# Patient Record
Sex: Female | Born: 1965 | Race: White | Hispanic: No | Marital: Married
Health system: Southern US, Community
[De-identification: ages and names within clinical notes are randomized; demographics above are authoritative.]

---

## 2002-08-27 ENCOUNTER — Encounter: Admission: RE | Admit: 2002-08-27 | Discharge: 2002-08-27 | Payer: Self-pay

## 2005-09-19 ENCOUNTER — Emergency Department (HOSPITAL_COMMUNITY): Admission: EM | Admit: 2005-09-19 | Discharge: 2005-09-20 | Payer: Self-pay | Admitting: Emergency Medicine

## 2009-08-03 ENCOUNTER — Encounter: Admission: RE | Admit: 2009-08-03 | Discharge: 2009-08-03 | Payer: Self-pay | Admitting: Family Medicine

## 2010-07-25 ENCOUNTER — Other Ambulatory Visit: Payer: Self-pay | Admitting: Family Medicine

## 2010-07-25 DIAGNOSIS — N939 Abnormal uterine and vaginal bleeding, unspecified: Secondary | ICD-10-CM

## 2010-07-30 ENCOUNTER — Ambulatory Visit
Admission: RE | Admit: 2010-07-30 | Discharge: 2010-07-30 | Disposition: A | Discharge status: Home / Self Care | Payer: No Typology Code available for payment source | Source: Ambulatory Visit | Attending: Family Medicine | Admitting: Family Medicine

## 2010-07-30 DIAGNOSIS — N939 Abnormal uterine and vaginal bleeding, unspecified: Secondary | ICD-10-CM

## 2010-08-22 ENCOUNTER — Other Ambulatory Visit: Payer: Self-pay | Admitting: Gynecology

## 2010-08-22 ENCOUNTER — Ambulatory Visit: Payer: No Typology Code available for payment source | Attending: Gynecology | Admitting: Gynecology

## 2010-08-22 DIAGNOSIS — N938 Other specified abnormal uterine and vaginal bleeding: Secondary | ICD-10-CM | POA: Insufficient documentation

## 2010-08-22 DIAGNOSIS — F17200 Nicotine dependence, unspecified, uncomplicated: Secondary | ICD-10-CM | POA: Insufficient documentation

## 2010-08-22 DIAGNOSIS — N949 Unspecified condition associated with female genital organs and menstrual cycle: Secondary | ICD-10-CM | POA: Insufficient documentation

## 2010-08-22 DIAGNOSIS — N9489 Other specified conditions associated with female genital organs and menstrual cycle: Secondary | ICD-10-CM | POA: Insufficient documentation

## 2010-08-23 NOTE — Consult Note (Signed)
NAMETORRIN, FREIN                 ACCOUNT NO.:  [id number]  MEDICAL RECORD NO.:  [id number]          PATIENT TYPE:  LOCATION:                                 FACILITY:  PHYSICIAN:  De Blanch, M.D. DATE OF BIRTH:  DATE OF CONSULTATION:  08/22/2010 DATE OF DISCHARGE:                                CONSULTATION   REFERRING PHYSICIAN:  Burnell Blanks, MD  CHIEF COMPLAINT:  Abnormal bleeding and right adnexal mass.  HISTORY OF PRESENT ILLNESS:  This 45 year old white separated female is seen in consultation at request of Dr. Nathanial Rancher regarding further evaluation of an abnormal bleeding and a recently recognized right adnexal mass.  The patient had approximately 6 weeks of bleeding in January and into February.  As part of the workup, she underwent a transvaginal ultrasound that showed an endometrial stripe of 5 mm and a right adnexal solid mass measuring 5.9 x 6.3 x 4.7 cm.  There was no evidence of free fluid and the left ovary appeared normal.  The patient has resumed bleeding as of early April which is considered spotting at the present time.  She denies any cramping or any constitutional symptoms or weight loss.  The patient has no significant past gynecologic history.  PAST MEDICAL HISTORY/MEDICAL ILLNESSES:  Kidney stones, the patient passed 2 years ago.  PAST SURGICAL HISTORY:  Cesarean section, arthroscopic knee surgery, excision of lymph node in the neck  OBSTETRICAL HISTORY:  Gravida 2.  CURRENT MEDICATIONS:  None.  DRUG ALLERGIES:  SULFA (hives).  SOCIAL HISTORY:  The patient is separated.  She is an Airline pilot.  She smokes 1 pack per day.  She recently had a Pap smear that was negative.  REVIEW OF SYSTEMS:  Ten-point comprehensive review of systems negative except as noted above.  PHYSICAL EXAMINATION:  VITAL SIGNS:  Height 5 feet 1 inch, weight 163 pounds, blood pressure 198/60. GENERAL:  The patient is a healthy white female, in no acute  distress. HEENT:  Negative. NECK:  Supple without thyromegaly.  There is no supraclavicular or inguinal adenopathy. ABDOMEN:  Soft, nontender.  No mass, organomegaly, ascites or hernias noted. PELVIC EXAM:  EG, BUS, vagina, bladder, urethra are normal.  The cervix is parous.  Uterus is retroverted and upper limits of normal size.  I am unable to palpate the adnexal mass.  Rectovaginal exam confirms. EXTREMITIES:  Lower extremities without edema or varicosities.  PROCEDURE NOTE:  Endometrial biopsy is performed with a Pipelle aspirator on 2 passes obtaining a minimal amount of tissue which is sent to pathology.  IMPRESSION: 1. Abnormal uterine bleeding in a perimenopausal patient.  Certainly     this may be normal, but we will await the results of the biopsy     before making further recommendations. 2. Solid adnexal mass.  I recommended to the patient that this be     removed surgically and intraoperative frozen section be obtained.     Certainly this could be a benign tumor such as an ovarian fibroma     but could in addition be malignancy such as a granulosis cell     tumor.  Pros and cons of removing the other ovary and completing a     hysterectomy were discussed in detail.  At the present time, the     patient is inclined to proceed with total abdominal hysterectomy,     bilateral salpingo-oophorectomy.  She understands that if she has     ovarian cancer, then surgical staging including omentectomy and     lymphadenectomy will be performed.  We will await endometrial biopsy report before making final plans, but we will tentatively schedule surgery for Sep 11, 2010.  I believe if there is nothing that points towards a malignancy, then we could proceed using a Pfannenstiel incision.     De Blanch, M.D.     DC/MEDQ  D:  08/22/2010  T:  08/22/2010  Job:  [id number]  cc:   Telford Nab, R.N. [address] [place], [place] [address]  Burnell Blanks, MD Fax:  [phone]  Electronically Signed by De Blanch M.D. on 08/23/2010 03:54:03 PM

## 2010-08-31 ENCOUNTER — Other Ambulatory Visit: Payer: Self-pay | Admitting: Gynecology

## 2010-08-31 ENCOUNTER — Ambulatory Visit (HOSPITAL_COMMUNITY)
Admission: RE | Admit: 2010-08-31 | Discharge: 2010-08-31 | Disposition: A | Discharge status: Home / Self Care | Payer: Self-pay | Source: Ambulatory Visit | Attending: Obstetrics & Gynecology | Admitting: Obstetrics & Gynecology

## 2010-08-31 ENCOUNTER — Other Ambulatory Visit: Payer: Self-pay | Admitting: Obstetrics & Gynecology

## 2010-08-31 ENCOUNTER — Encounter (HOSPITAL_COMMUNITY): Payer: Self-pay

## 2010-08-31 DIAGNOSIS — Z01811 Encounter for preprocedural respiratory examination: Secondary | ICD-10-CM

## 2010-08-31 DIAGNOSIS — Z01818 Encounter for other preprocedural examination: Secondary | ICD-10-CM | POA: Insufficient documentation

## 2010-08-31 DIAGNOSIS — F17200 Nicotine dependence, unspecified, uncomplicated: Secondary | ICD-10-CM | POA: Insufficient documentation

## 2010-08-31 DIAGNOSIS — Z01812 Encounter for preprocedural laboratory examination: Secondary | ICD-10-CM | POA: Insufficient documentation

## 2010-08-31 DIAGNOSIS — R1900 Intra-abdominal and pelvic swelling, mass and lump, unspecified site: Secondary | ICD-10-CM

## 2010-08-31 LAB — COMPREHENSIVE METABOLIC PANEL
CO2: 23 mEq/L (ref 19–32)
Calcium: 9.2 mg/dL (ref 8.4–10.5)
Creatinine, Ser: <0.47 mg/dL (ref 0.4–1.2)
Glucose, Bld: 83 mg/dL (ref 70–99)
Sodium: 137 mEq/L (ref 135–145)
Total Protein: 6.7 g/dL (ref 6.0–8.3)

## 2010-08-31 LAB — DIFFERENTIAL
Basophils Relative: 0 % (ref 0–1)
Eosinophils Relative: 1 % (ref 0–5)
Monocytes Relative: 8 % (ref 3–12)
Neutrophils Relative %: 64 % (ref 43–77)

## 2010-08-31 LAB — CBC
HCT: 41 % (ref 36.0–46.0)
Hemoglobin: 13.8 g/dL (ref 12.0–15.0)
MCH: 32.2 pg (ref 26.0–34.0)
MCHC: 33.7 g/dL (ref 30.0–36.0)
RDW: 12.5 % (ref 11.5–15.5)

## 2010-08-31 LAB — HCG, SERUM, QUALITATIVE: Preg, Serum: NEGATIVE

## 2010-08-31 LAB — SURGICAL PCR SCREEN
MRSA, PCR: NEGATIVE
Staphylococcus aureus: NEGATIVE

## 2010-09-11 ENCOUNTER — Inpatient Hospital Stay (HOSPITAL_COMMUNITY)
Admission: RE | Admit: 2010-09-11 | Discharge: 2010-09-13 | DRG: 743 | Disposition: A | Discharge status: Home / Self Care | Payer: Self-pay | Source: Ambulatory Visit | Attending: Obstetrics & Gynecology | Admitting: Obstetrics & Gynecology

## 2010-09-11 ENCOUNTER — Other Ambulatory Visit: Payer: Self-pay | Admitting: Gynecology

## 2010-09-11 DIAGNOSIS — Z01812 Encounter for preprocedural laboratory examination: Secondary | ICD-10-CM

## 2010-09-11 DIAGNOSIS — F17200 Nicotine dependence, unspecified, uncomplicated: Secondary | ICD-10-CM | POA: Diagnosis present

## 2010-09-11 DIAGNOSIS — D279 Benign neoplasm of unspecified ovary: Principal | ICD-10-CM | POA: Diagnosis present

## 2010-09-11 LAB — ABO/RH: ABO/RH(D): O NEG

## 2010-09-12 LAB — BASIC METABOLIC PANEL
CO2: 23 mEq/L (ref 19–32)
Calcium: 8.1 mg/dL — ABNORMAL LOW (ref 8.4–10.5)
Creatinine, Ser: <0.47 mg/dL (ref 0.4–1.2)
Sodium: 136 mEq/L (ref 135–145)

## 2010-09-12 LAB — TYPE AND SCREEN
Antibody Screen: POSITIVE
Donor AG Type: NEGATIVE
PT AG Type: NEGATIVE
Unit division: 0
Unit division: 0

## 2010-09-12 LAB — CBC
MCH: 31.5 pg (ref 26.0–34.0)
MCHC: 33.1 g/dL (ref 30.0–36.0)
Platelets: 204 10*3/uL (ref 150–400)
RDW: 12.1 % (ref 11.5–15.5)

## 2010-09-13 NOTE — Op Note (Signed)
NAMEHARVEY, Lauren Woods                 ACCOUNT NO.:  [id number]  MEDICAL RECORD NO.:  [id number]           PATIENT TYPE:  I  LOCATION:  0008                         FACILITY:  [facility]  PHYSICIAN:  De Blanch, M.D.DATE OF BIRTH:  [birth year]  DATE OF PROCEDURE:  09/11/2010 DATE OF DISCHARGE:                              OPERATIVE REPORT   PREOPERATIVE DIAGNOSIS:  Complex adnexal mass.  POSTOPERATIVE DIAGNOSIS:  Right ovarian Brenner tumor.  PROCEDURE:  Total abdominal hysterectomy, bilateral salpingo- oophorectomy.  SURGEON:  De Blanch, M.D.  FIRST ASSISTANT:  Roseanna Rainbow, M.D. and Telford Nab, R.N.  ANESTHESIA:  General with orotracheal tube.  ESTIMATED BLOOD LOSS:  50 mL.  SURGICAL FINDINGS:  At the time of exploratory laparotomy, the patient was found to have a solid 6-cm right ovarian tumor.  On frozen section, this was called a benign Brenner tumor.  The left ovary and uterus were normal.  Exploration of the upper abdomen was normal.  PROCEDURE:  The patient was brought to the operating room and after satisfactory attainment of general anesthesia, she was placed in modified lithotomy position in [place] stirrups.  The anterior abdominal wall, perineum and vagina were prepped.  Foley catheter was inserted. The patient was draped.  The abdomen was entered through Pfannenstiel's incision.  Peritoneal washings were obtained from the pelvis but when the frozen section returned as benign, the washings were discarded.  The upper abdomen and pelvis were explored with the above-noted findings.  A Bookwalter retractor was assembled and the small bowel packed out of the pelvis.  The right round ligament was divided and the retroperitoneal space opened identifying the iliac vessels and ureter.  The ovarian vessels were skeletonized, clamped, cut, free tied and suture ligated. The broad ligament was incised beneath the ovarian tumor.  Uterine cornu was  doubly clamped and the tube and ovarian ligament were divided.  The right tube and ovary were handed off the operative field for frozen section with the above-noted results.  The left round ligament was divided and left retroperitoneal space was opened and the ovarian vessels skeletonized, clamped, cut, free tied and suture ligated.  The bladder flap was advanced through sharp and blunt dissection.  Uterine vessels were skeletonized and clamped, cut and suture ligated in a stepwise fashion.  The paracervical and cardinal ligaments were clamped, cut and suture ligated.  The bladder was advanced throughout this portion of procedure until we encountered the vaginal angles.  These were crossclamped and the vagina transected from its connection to the cervix.  The uterus, cervix, left tube and ovary handed off the operative field for permanent section.  Vaginal angles were transfixed.  The central portion of the vagina closed with interrupted figure-of-eight sutures of 0 Vicryl.  The pelvis was irrigated and hemostasis achieved with cautery.  The packs and retractors were removed.  Anterior abdominal wall was closed in layers, the first being a running suture of 2-0 Vicryl on the peritoneum.  The fascia was reapproximated with a running suture of 0 PDS.  Subcutaneous tissue was irrigated.  Hemostasis achieved with cautery.  The skin was closed  with skin staples and a dressing was applied.  The patient was awakened from anesthesia and taken to the recovery room in satisfactory condition.  Sponge, needle and instrument counts correct x2.     De Blanch, M.D.     DC/MEDQ  D:  09/11/2010  T:  09/11/2010  Job:  [id number]  cc:   Roseanna Rainbow, M.D. Fax: [phone]  Telford Nab, R.N. [address] [place], [place] [address]  Burnell Blanks, MD Fax: [phone]  Electronically Signed by De Blanch M.D. on 09/13/2010 10:09:36 AM

## 2010-10-05 NOTE — Discharge Summary (Signed)
  [name], [name]                 ACCOUNT NO.:  [id number]  MEDICAL RECORD NO.:  [id number]           PATIENT TYPE:  I  LOCATION:  1531                         FACILITY:  [facility]  PHYSICIAN:  Roseanna Rainbow, M.D.DATE OF BIRTH:  05-24-1965  DATE OF ADMISSION:  09/11/2010 DATE OF DISCHARGE:  09/13/2010                              DISCHARGE SUMMARY   CHIEF COMPLAINT:  Patient is a 45 year old with a pelvic mass who presents for a total abdominal hysterectomy and bilateral salpingo-oophorectomy and possible staging.  Please see the dictated history and physical.  HOSPITAL COURSE:  Patient was admitted and underwent a total abdominal hysterectomy and bilateral salpingo-oophorectomy.  Please see the dictated operative summary for findings.  On postoperative day 1, the hemoglobin was 11.2.  Basic metabolic profile was normal.  Her diet was advanced.  She was tolerating a regular diet on the day of discharge. She was discharged to home on postoperative day 2.  Prior to discharge she was started on an estrogen patch.  DISCHARGE DIAGNOSES: 1. Right ovarian benign Darnelle Bos tumor. 2. Adenomyosis.  PROCEDURE:  Total abdominal hysterectomy and bilateral salpingo- oophorectomy.  CONDITION:  Good.  DIET:  Regular.  ACTIVITY:  Pelvic rest progressive activity.  MEDICATIONS: 1. Climara 0.1 mg. 2. Percocet 5/325 one to two tablets every 6 hours as needed.  DISPOSITION:  The patient was to follow up in the GYN and oncology office.     Roseanna Rainbow, M.D.     LAJ/MEDQ  D:  09/26/2010  T:  09/26/2010  Job:  [id number]  cc:   Telford Nab, R.N. [address] [place], [place] [address]  Burnell Blanks, MD Fax: [phone]  Electronically Signed by Antionette Char M.D. on 10/05/2010 01:22:51 PM

## 2010-10-25 ENCOUNTER — Ambulatory Visit: Payer: Self-pay | Attending: Gynecologic Oncology | Admitting: Gynecologic Oncology

## 2010-10-25 DIAGNOSIS — Z9079 Acquired absence of other genital organ(s): Secondary | ICD-10-CM | POA: Insufficient documentation

## 2010-10-25 DIAGNOSIS — R209 Unspecified disturbances of skin sensation: Secondary | ICD-10-CM | POA: Insufficient documentation

## 2010-10-25 DIAGNOSIS — Z90710 Acquired absence of both cervix and uterus: Secondary | ICD-10-CM | POA: Insufficient documentation

## 2010-10-25 DIAGNOSIS — D279 Benign neoplasm of unspecified ovary: Secondary | ICD-10-CM | POA: Insufficient documentation

## 2010-10-26 NOTE — Consult Note (Signed)
  Lauren Woods, Lauren Woods                 ACCOUNT NO.:  [id number]  MEDICAL RECORD NO.:  [id number]  LOCATION:  GYN                          FACILITY:  [facility]  PHYSICIAN:  Laurette Schimke, MD     DATE OF BIRTH:  Jun 02, 1965  DATE OF CONSULTATION:  10/25/2010 DATE OF DISCHARGE:                                CONSULTATION   REFERRING PHYSICIAN:  Burnell Blanks, MD  REASON FOR VISIT:  Postoperative check.  HISTORY OF PRESENT ILLNESS:  This is a 45 year old who was seen in consultation at request of Dr. Nathanial Rancher for evaluation of abnormal bleeding and an adnexal mass.  On Sep 08, 2010, she underwent total abdominal hysterectomy, bilateral salpingo-oophorectomy.  Final pathology was notable for a 7-cm Brenner tumor without atypia.  There was no evidence of endometrial hyperplasia or atypia and the cervix was noted to have benign squamous mucosa.  Lauren Woods has done well postoperatively.  She denies nausea, vomiting, or abdominal pain.  PAST MEDICAL HISTORY:  No interim changes.  PAST SURGICAL HISTORY:  No interim changes.  REVIEW OF SYSTEMS:  She reports numbness at the site of the incision, firmness in the midportion of the incision.  No nausea, vomiting, abdominal pain, fever, chills.  Normal bowel movements.  No bleeding from the rectum, bladder, or vagina.  No lower extremity edema.  PHYSICAL EXAMINATION:  GENERAL:  Well-developed female, in no acute distress. VITAL SIGNS:  Weight 158 pounds, blood pressure 100/68, pulse 64, respiratory rate of 16, temperature 98.5. CHEST:  Clear to auscultation. ABDOMEN:  Soft, nontender.  Pfannenstiel incision well healed.  No erythema.  No evidence of a hernia.  No tenderness. PELVIC EXAMINATION:  Vagina within normal limits.  Cuff intact.  No vaginal discharge or bleeding.  No cul-de-sac masses or fullness.  IMPRESSION:  Status post total abdominal hysterectomy, bilateral salpingo-oophorectomy for benign Brenner tumor.  Lauren Woods states that the   hot flashes are controlled on her current estrogen patch.  She will follow up with Dr. Burnell Blanks regarding her preferences for management of vasomotor symptomatology.  Thank you for allowing [place] to participate in the care of this patient.     Laurette Schimke, MD     WB/MEDQ  D:  10/25/2010  T:  10/25/2010  Job:  [id number]  cc:   Burnell Blanks, MD Fax: [phone]  Telford Nab, R.N. [phone] [place], [place] [address]  Electronically Signed by Laurette Schimke MD on 10/26/2010 08:02:22 AM

## 2017-03-04 ENCOUNTER — Telehealth: Payer: Self-pay | Admitting: *Deleted

## 2017-03-04 ENCOUNTER — Ambulatory Visit (INDEPENDENT_AMBULATORY_CARE_PROVIDER_SITE_OTHER): Payer: Non-veteran care | Admitting: Podiatry

## 2017-03-04 ENCOUNTER — Encounter: Payer: Self-pay | Admitting: Podiatry

## 2017-03-04 DIAGNOSIS — M779 Enthesopathy, unspecified: Secondary | ICD-10-CM

## 2017-03-04 DIAGNOSIS — M7741 Metatarsalgia, right foot: Secondary | ICD-10-CM

## 2017-03-04 MED ORDER — MELOXICAM 15 MG PO TABS: 15.0000 mg | ORAL_TABLET | Freq: Every day | ORAL | 0 refills | Status: AC

## 2017-03-04 NOTE — Telephone Encounter (Signed)
Left message informing pt, that the prescription from DR. Jacqualyn Posey could be picked up in the [place] office, or called to a local pharmacy, and the medication was on the $4.00 formulary at [facility].

## 2017-03-05 NOTE — Telephone Encounter (Signed)
Left message informing pt, I needed a call with a pharmacy preference, or the [facility] fax number and the ordered medication is on the [place] and [facility] formulary.

## 2017-03-05 NOTE — Progress Notes (Signed)
Subjective:    Patient ID: Lauren Woods, female   DOB: 51 y.o.   MRN: [id number]   HPI Ms. Herringshaw presents the office today for concerns her right foot pain which is been ongoing for about 2.5 months. She states that she feels like there is a gel pad in the ball of her foot on the right side. She describes it as an aching pain at times She denies any numbness or tingling coming to the toes or any sharp, shooting pains. She denies any recent injury or trauma. She did have an x-ray to [place] which did not reveal a fracture, No recent treatment and no other complaints.   Review of Systems  All other systems reviewed and are negative.       Objective:  Physical Exam History reviewed. No pertinent past medical history.  History reviewed. No pertinent surgical history.   Current Outpatient Medications:  .  meloxicam (MOBIC) 15 MG tablet, Take 1 tablet (15 mg total) daily by mouth., Disp: 30 tablet, Rfl: 0  Allergies  Allergen Reactions  . Sulfa Antibiotics     Social History   Socioeconomic History  . Marital status: Married    Spouse name: Not on file  . Number of children: Not on file  . Years of education: Not on file  . Highest education level: Not on file  Social Needs  . Financial resource strain: Not on file  . Food insecurity - worry: Not on file  . Food insecurity - inability: Not on file  . Transportation needs - medical: Not on file  . Transportation needs - non-medical: Not on file  Occupational History  . Not on file  Tobacco Use  . Smoking status: Unknown If Ever Smoked  . Smokeless tobacco: Never Used  Substance and Sexual Activity  . Alcohol use: Not on file  . Drug use: Not on file  . Sexual activity: Not on file  Other Topics Concern  . Not on file  Social History Narrative  . Not on file        Assessment:     General: AAO x3, NAD  Dermatological: Skin is warm, dry and supple bilateral. Nails x 10 are well manicured; remaining integument appears  unremarkable at this time. There are no open sores, no preulcerative lesions, no rash or signs of infection present.  Vascular: Dorsalis Pedis artery and Posterior Tibial artery pedal pulses are 2/4 bilateral with immedate capillary fill time.There is no pain with calf compression, swelling, warmth, erythema.   Neruologic: Grossly intact via light touch bilateral. Protective threshold with Semmes Wienstein monofilament intact to all pedal sites bilateral.   Musculoskeletal: There is mild tenderness to palpation to the right 2nd and 3rd interspaces but there is no area pinpoint bony tenderness to the foot or ankle. I am unable to palpate a neuroma. There is no overlying edema, erythema, increase in warmth. Ankle, subtalar joint range of motion intact. Muscular strength 5/5 in all groups tested bilateral.  Gait: Unassisted, Nonantalgic.      Plan:     51 year old female with right foot metatarsalgia, capsulitis -Treatment options discussed including all alternatives, risks, and complications -Etiology of symptoms were discussed -I reviewed the patient's chart as well as the x-ray report and she also had pictures on her phone of the x-rays I looked at.  -Discuss a steroid injection but she declined -Prescribed mobic. Discussed side effects of the medication and directed to stop if any are to occur and call the  office.  -Metatarsal offlaoding pads -Discussed shoe changes as she is wearing a flexible shoe and not giving any support. Would start with shoe changes then if still having pain would add an insert to her shoes.  -Ice -RTC 6 weeks or sooner if needed. She agrees to this plan.   Celesta Gentile, DPM

## 2017-03-05 NOTE — Telephone Encounter (Signed)
I was returning a telephone call from here. I had an appointment earlier today. You can reach me back at [phone]. Thank you.

## 2017-04-14 ENCOUNTER — Ambulatory Visit: Payer: No Typology Code available for payment source | Admitting: Podiatry

## 2021-10-04 ENCOUNTER — Emergency Department (HOSPITAL_COMMUNITY)
Admission: EM | Admit: 2021-10-04 | Discharge: 2021-10-05 | Disposition: A | Discharge status: Home / Self Care | Payer: No Typology Code available for payment source | Attending: Emergency Medicine | Admitting: Emergency Medicine

## 2021-10-04 ENCOUNTER — Other Ambulatory Visit: Payer: Self-pay

## 2021-10-04 ENCOUNTER — Encounter (HOSPITAL_COMMUNITY): Payer: Self-pay | Admitting: Emergency Medicine

## 2021-10-04 DIAGNOSIS — R1030 Lower abdominal pain, unspecified: Secondary | ICD-10-CM | POA: Diagnosis not present

## 2021-10-04 DIAGNOSIS — K9189 Other postprocedural complications and disorders of digestive system: Secondary | ICD-10-CM | POA: Diagnosis not present

## 2021-10-04 DIAGNOSIS — R339 Retention of urine, unspecified: Secondary | ICD-10-CM | POA: Insufficient documentation

## 2021-10-04 DIAGNOSIS — D72829 Elevated white blood cell count, unspecified: Secondary | ICD-10-CM | POA: Insufficient documentation

## 2021-10-04 DIAGNOSIS — K5900 Constipation, unspecified: Secondary | ICD-10-CM | POA: Diagnosis not present

## 2021-10-04 DIAGNOSIS — N9989 Other postprocedural complications and disorders of genitourinary system: Secondary | ICD-10-CM

## 2021-10-04 LAB — COMPREHENSIVE METABOLIC PANEL
ALT: 31 U/L (ref 0–44)
AST: 42 U/L — ABNORMAL HIGH (ref 15–41)
Albumin: 3.6 g/dL (ref 3.5–5.0)
Alkaline Phosphatase: 54 U/L (ref 38–126)
Anion gap: 9 (ref 5–15)
BUN: 9 mg/dL (ref 6–20)
CO2: 25 mmol/L (ref 22–32)
Calcium: 8.7 mg/dL — ABNORMAL LOW (ref 8.9–10.3)
Chloride: 106 mmol/L (ref 98–111)
Creatinine, Ser: 0.7 mg/dL (ref 0.44–1.00)
GFR, Estimated: >60 mL/min (ref >60)
Glucose, Bld: 142 mg/dL — ABNORMAL HIGH (ref 70–99)
Potassium: 3.9 mmol/L (ref 3.5–5.1)
Sodium: 140 mmol/L (ref 135–145)
Total Bilirubin: 0.7 mg/dL (ref 0.3–1.2)
Total Protein: 6.8 g/dL (ref 6.5–8.1)

## 2021-10-04 LAB — CBC
HCT: 42.3 % (ref 36.0–46.0)
Hemoglobin: 14 g/dL (ref 12.0–15.0)
MCH: 32.6 pg (ref 26.0–34.0)
MCHC: 33.1 g/dL (ref 30.0–36.0)
MCV: 98.4 fL (ref 80.0–100.0)
Platelets: 218 10*3/uL (ref 150–400)
RBC: 4.3 MIL/uL (ref 3.87–5.11)
RDW: 12.4 % (ref 11.5–15.5)
WBC: 21.2 10*3/uL — ABNORMAL HIGH (ref 4.0–10.5)
nRBC: 0 % (ref 0.0–0.2)

## 2021-10-04 LAB — LIPASE, BLOOD: Lipase: 21 U/L (ref 11–51)

## 2021-10-04 MED ORDER — HYDROCODONE-ACETAMINOPHEN 5-325 MG PO TABS
1.0000 | ORAL_TABLET | Freq: Once | ORAL | Status: AC
  Administered 2021-10-05: 1 via ORAL
  Filled 2021-10-04: qty 1

## 2021-10-04 NOTE — ED Triage Notes (Signed)
Pt reported to ED with c/o inability to urinate and constipation since having gallbladder removed today.  Pt states "it hurts to move".

## 2021-10-04 NOTE — ED Provider Triage Note (Signed)
Emergency Medicine Provider Triage Evaluation Note  Lauren Woods , a 56 y.o. female  was evaluated in triage.  Pt complains of abdominal pain, inability to urinate or pass flatus since being discharged around 5 PM following cholecystectomy.  This was done in the [place] system.  Denies fever, chills.  Endorses significant abdominal pain.  Review of Systems  Positive: As above Negative: As above  Physical Exam  BP 121/75   Pulse 81   Temp 98.5 F (36.9 C) (Oral)   Resp 16   SpO2 94%  Gen:   Awake, no distress   Resp:  Normal effort  MSK:   Moves extremities without difficulty  Other:  Diffuse abdominal tenderness  Medical Decision Making  Medically screening exam initiated at 11:18 PM.  Appropriate orders placed.  Christeen Lai was informed that the remainder of the evaluation will be completed by another provider, this initial triage assessment does not replace that evaluation, and the importance of remaining in the ED until their evaluation is complete.     Evlyn Courier, PA-C 10/04/21 2319

## 2021-10-05 ENCOUNTER — Emergency Department (HOSPITAL_COMMUNITY): Payer: No Typology Code available for payment source

## 2021-10-05 LAB — URINALYSIS, ROUTINE W REFLEX MICROSCOPIC
Bacteria, UA: NONE SEEN
Bilirubin Urine: NEGATIVE
Glucose, UA: NEGATIVE mg/dL
Ketones, ur: 5 mg/dL — AB
Leukocytes,Ua: NEGATIVE
Nitrite: NEGATIVE
Protein, ur: 30 mg/dL — AB
Specific Gravity, Urine: 1.025 (ref 1.005–1.030)
pH: 5 (ref 5.0–8.0)

## 2021-10-05 MED ORDER — IOHEXOL 300 MG/ML  SOLN
100.0000 mL | Freq: Once | INTRAMUSCULAR | Status: AC | PRN
  Administered 2021-10-05: 100 mL via INTRAVENOUS

## 2021-10-05 MED ORDER — IOHEXOL 350 MG/ML SOLN: 100.0000 mL | Freq: Once | INTRAVENOUS | Status: DC | PRN

## 2021-10-05 NOTE — ED Notes (Signed)
PT bladder scanned with a result of 0 mL

## 2021-10-05 NOTE — ED Provider Notes (Signed)
[facility] EMERGENCY DEPARTMENT Provider Note  CSN: [id number] Arrival date & time: 10/04/21 2129  Chief Complaint(s) Post-op Problem  HPI Lauren Woods is a 56 y.o. female status post lap Cholecystectomy earlier in the day presents to the emergency department for urinary retention and constipation.  She is endorsing suprapubic abdominal discomfort that is moderate to severe and worse with movement and palpation.  No nausea or vomiting.   The history is provided by the patient.    Past Medical History History reviewed. No pertinent past medical history. There are no problems to display for this patient.  Home Medication(s) Prior to Admission medications   Not on File                                                                                                                                    Allergies Sulfa antibiotics  Review of Systems Review of Systems As noted in HPI  Physical Exam Vital Signs  I have reviewed the triage vital signs BP 108/70   Pulse 95   Temp 98.5 F (36.9 C) (Oral)   Resp 18   SpO2 95%   Physical Exam Vitals reviewed.  Constitutional:      General: She is not in acute distress.    Appearance: She is well-developed. She is not diaphoretic.  HENT:     Head: Normocephalic and atraumatic.     Right Ear: External ear normal.     Left Ear: External ear normal.     Nose: Nose normal.  Eyes:     General: No scleral icterus.    Conjunctiva/sclera: Conjunctivae normal.  Neck:     Trachea: Phonation normal.  Cardiovascular:     Rate and Rhythm: Normal rate and regular rhythm.  Pulmonary:     Effort: Pulmonary effort is normal. No respiratory distress.     Breath sounds: No stridor.  Abdominal:     General: There is no distension.     Tenderness: There is no abdominal tenderness.     Comments: Trochar sites are C/D/I.   Musculoskeletal:        General: Normal range of motion.     Cervical back: Normal range of motion.   Neurological:     Mental Status: She is alert and oriented to person, place, and time.  Psychiatric:        Behavior: Behavior normal.     ED Results and Treatments Labs (all labs ordered are listed, but only abnormal results are displayed) Labs Reviewed  COMPREHENSIVE METABOLIC PANEL - Abnormal; Notable for the following components:      Result Value   Glucose, Bld 142 (*)    Calcium 8.7 (*)    AST 42 (*)    All other components within normal limits  CBC - Abnormal; Notable for the following components:   WBC 21.2 (*)    All other components within normal limits  URINALYSIS,  ROUTINE W REFLEX MICROSCOPIC - Abnormal; Notable for the following components:   APPearance HAZY (*)    Hgb urine dipstick MODERATE (*)    Ketones, ur 5 (*)    Protein, ur 30 (*)    All other components within normal limits  LIPASE, BLOOD  CBC WITH DIFFERENTIAL/PLATELET                                                                                                                         EKG  EKG Interpretation  Date/Time:    Ventricular Rate:    PR Interval:    QRS Duration:   QT Interval:    QTC Calculation:   R Axis:     Text Interpretation:         Radiology CT ABDOMEN PELVIS W CONTRAST  Result Date: 10/05/2021 CLINICAL DATA:  Recent gallbladder removal abdominal pain EXAM: CT ABDOMEN AND PELVIS WITH CONTRAST TECHNIQUE: Multidetector CT imaging of the abdomen and pelvis was performed using the standard protocol following bolus administration of intravenous contrast. RADIATION DOSE REDUCTION: This exam was performed according to the departmental dose-optimization program which includes automated exposure control, adjustment of the mA and/or kV according to patient size and/or use of iterative reconstruction technique. CONTRAST:  174m OMNIPAQUE IOHEXOL 300 MG/ML  SOLN COMPARISON:  CT 11/09/2012 FINDINGS: Lower chest: Lung bases are clear. Hepatobiliary: Status post cholecystectomy. Trace  stranding at the gallbladder fossa likely due to recent surgery. Dilated common bile duct measuring 15 mm. No intra hepatic biliary dilatation. Subcentimeter hypodensity in the right hepatic lobe too small to further characterize Pancreas: Unremarkable. No pancreatic ductal dilatation or surrounding inflammatory changes. Spleen: Normal in size without focal abnormality. Adrenals/Urinary Tract: Adrenal glands are normal. Kidneys show no hydronephrosis. Multiple renal cysts and subcentimeter hypodensities too small to further characterize. No follow-up imaging is recommended. The bladder is unremarkable except for small amount of air probably due to recent instrumentation Stomach/Bowel: The stomach is non enlarged. There is no dilated small bowel. Fluid in the right colon without convincing inflammation. Negative appendix. Vascular/Lymphatic: Mild atherosclerosis. No aneurysm. No suspicious lymph node Reproductive: Status post hysterectomy. No adnexal masses. Other: Trace amounts of free air in the upper abdomen. Small free fluid in the pelvis. Musculoskeletal: Mild stranding in the subcutaneous soft tissues of the right upper quadrant and the periumbilical region likely due to laparoscopic port. IMPRESSION: 1. Trace amounts of free air, this is felt secondary to the history of recent gallbladder surgery. Minimal stranding at the gallbladder fossa is likely due to recent surgery as well. Common bile duct is dilated, but there is no intra hepatic biliary dilatation, correlate with LFTs 2. Small amount of free fluid in the pelvis Electronically Signed   By: KDonavan FoilM.D.   On: 10/05/2021 00:43    Pertinent labs & imaging results that were available during my care of the patient were reviewed by me and considered in my medical decision making (see MDM for details).  Medications Ordered in ED Medications  HYDROcodone-acetaminophen (NORCO/VICODIN) 5-325 MG per tablet 1 tablet (1 tablet Oral Given 10/05/21 0001)   iohexol (OMNIPAQUE) 300 MG/ML solution 100 mL (100 mLs Intravenous Contrast Given 10/05/21 0034)                                                                                                                                     Procedures Procedures  (including critical care time)  Medical Decision Making / ED Course    Complexity of Problem:  Co-morbidities/SDOH that complicate the patient evaluation/care: Noted above in HPI  Patient's presenting problem/concern, DDX, and MDM listed below: Urinary retention and constipation Considering urethral spasm versus anesthesia side effects We will obtain screening labs to assess for any electrolyte derangements, renal insufficiency, evidence of UTI. We will also get a CT scan to rule out obstruction. Prior to getting a CT scan patient was able to void.  Reported complete resolution of her symptoms.  Hospitalization Considered:  Yes  Initial Intervention:  IV fluids    Complexity of Data:   Laboratory Tests ordered listed below with my independent interpretation: CBC with leukocytosis.  No anemia No significant electrolyte derangements or renal sufficiency No evidence of biliary obstruction or pancreatitis UA without evidence of infection   Imaging Studies ordered listed below with my independent interpretation: CT scan notable for post operative changes without obvious obstruction.      ED Course:    Assessment, Add'l Intervention, and Reassessment: Urinary retention Patient was able to void after IV fluids. No need for Foley placement Rest of the work-up was reassuring without serious postoperative concerns or complications. On reexamination patient's abdomen was benign.   Final Clinical Impression(s) / ED Diagnoses Final diagnoses:  Postoperative urinary retention   The patient appears reasonably screened and/or stabilized for discharge and I doubt any other medical condition or other [facility] requiring further screening,  evaluation, or treatment in the ED at this time prior to discharge. Safe for discharge with strict return precautions.  Disposition: Discharge  Condition: Good  I have discussed the results, Dx and Tx plan with the patient/family who expressed understanding and agree(s) with the plan. Discharge instructions discussed at length. The patient/family was given strict return precautions who verbalized understanding of the instructions. No further questions at time of discharge.    ED Discharge Orders     None       Follow Up: Cyndi Bender, PA-C [place] [address] [phone]  Call  as needed           This chart was dictated using voice recognition software.  Despite best efforts to proofread,  errors can occur which can change the documentation meaning.    Fatima Blank, MD 10/05/21 [phone]

## 2021-10-05 NOTE — ED Notes (Signed)
Sts she urinated just prior to CT scan and feels much better. Rates pain at a 2/10.

## 2023-03-18 IMAGING — CT CT ABD-PELV W/ CM
2 of 5 series · 16 of 46 positions shown, 18 images · IV contrast (agent unspecified)
Comparison: CT 11/09/2012

CLINICAL DATA: Recent gallbladder removal abdominal pain

EXAM:
CT ABDOMEN AND PELVIS WITH CONTRAST
TECHNIQUE: Multidetector CT imaging of the abdomen and pelvis was performed
using the standard protocol following bolus administration of
intravenous contrast.

[Series 3: a/p w/ 5mm · axial · 0.83mm/px · z∈[+798,+1178]mm · 13 of 86 slices shown, 15 images]
[im 5/86  soft-tissue]
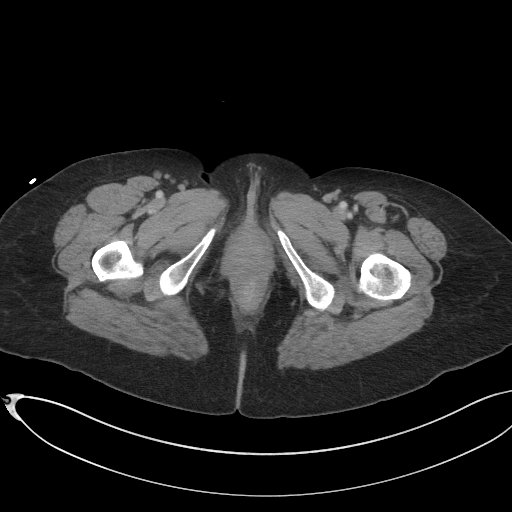
[im 5/86  bone]
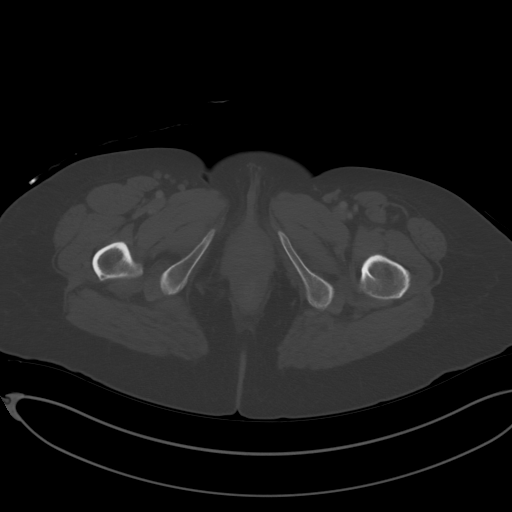
[im 14/86  soft-tissue]
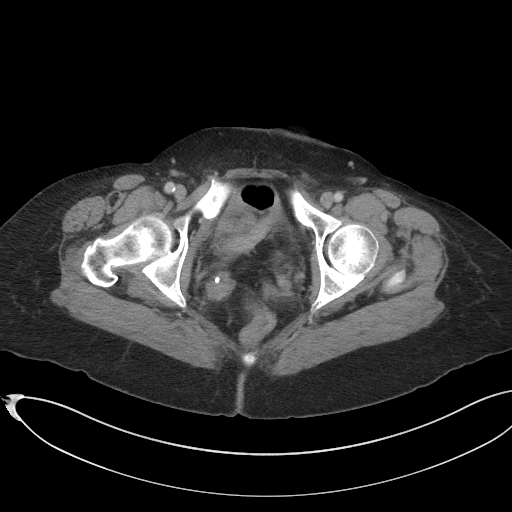
[im 18/86  soft-tissue]
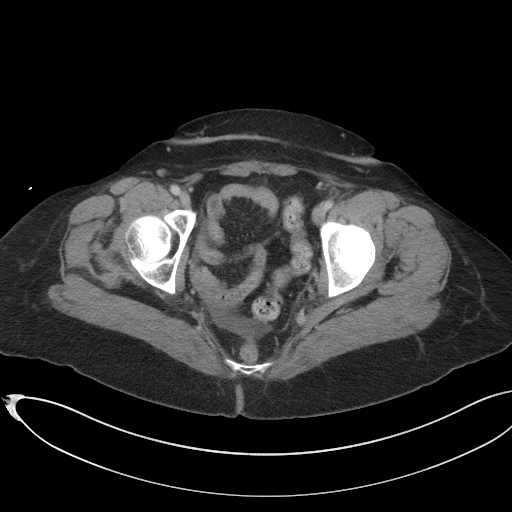
[im 23/86  soft-tissue]
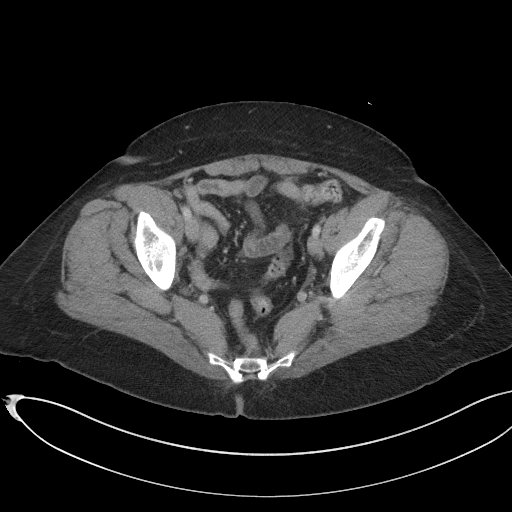
[im 32/86  soft-tissue]
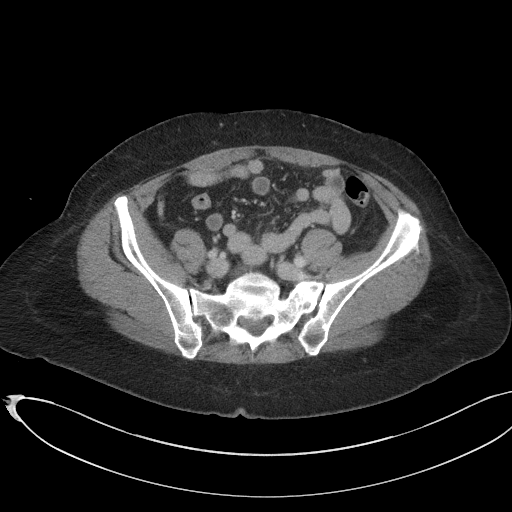
[im 36/86  soft-tissue]
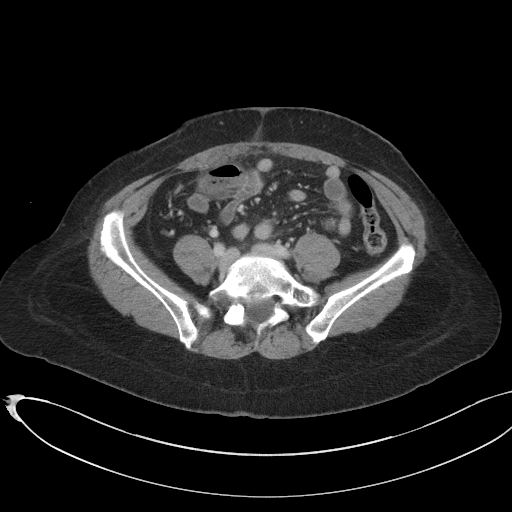
[im 45/86  soft-tissue]
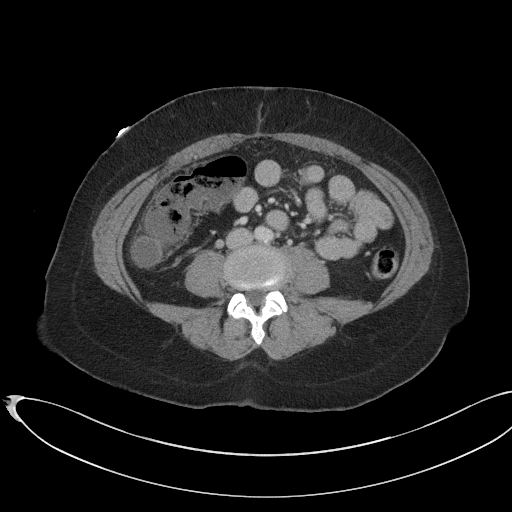
[im 50/86  soft-tissue]
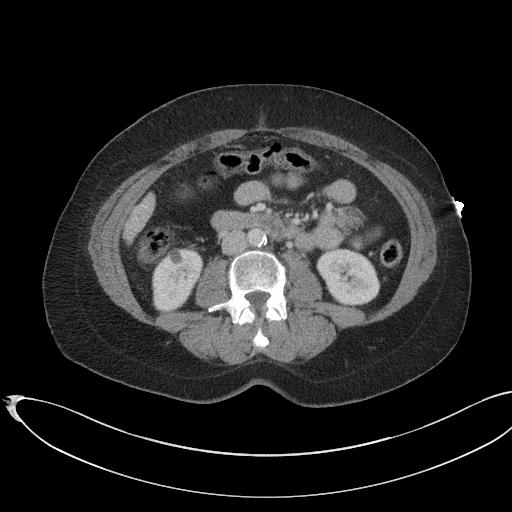
[im 54/86  soft-tissue]
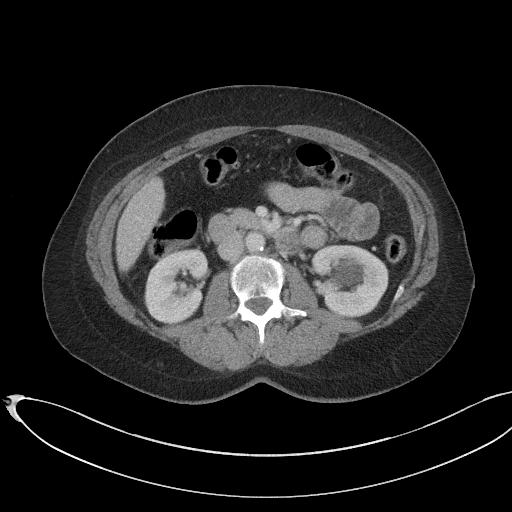
[im 54/86  bone]
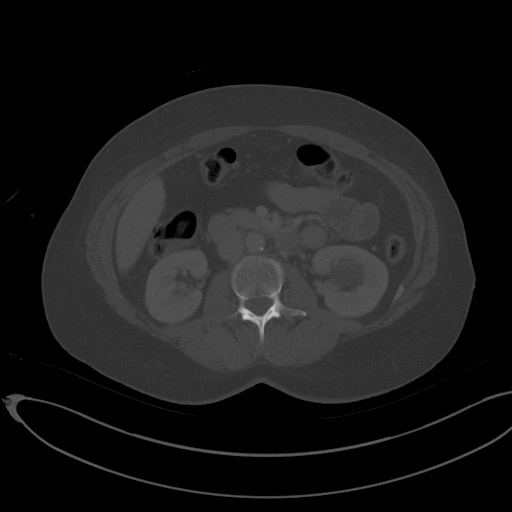
[im 63/86  soft-tissue]
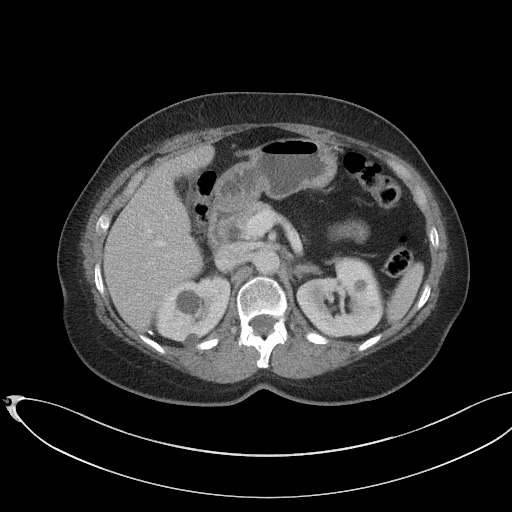
[im 68/86  soft-tissue]
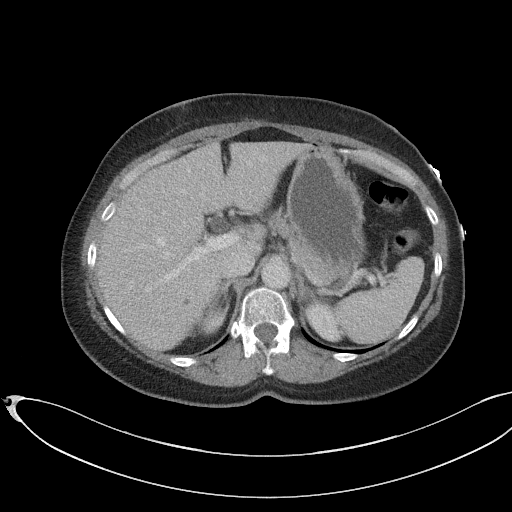
[im 72/86  soft-tissue]
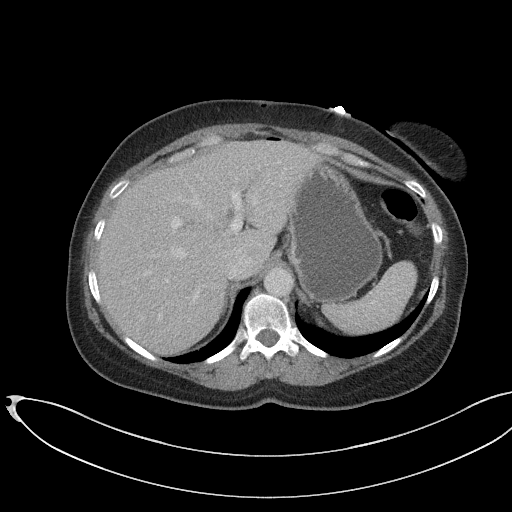
[im 81/86  soft-tissue]
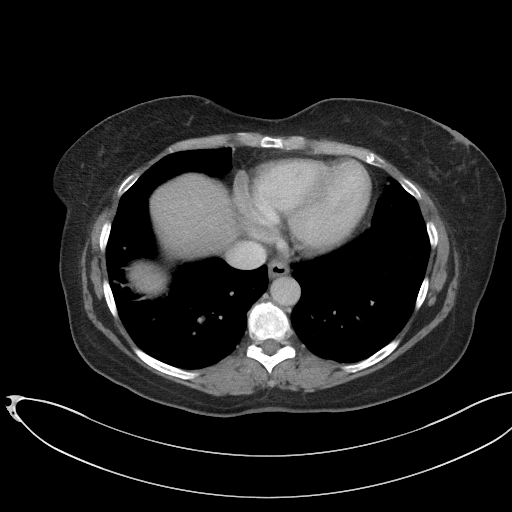

[Series 6: a/p w/ cor · coronal · 0.84mm/px · 3 of 151 slices shown]
[im 51/151  soft-tissue]
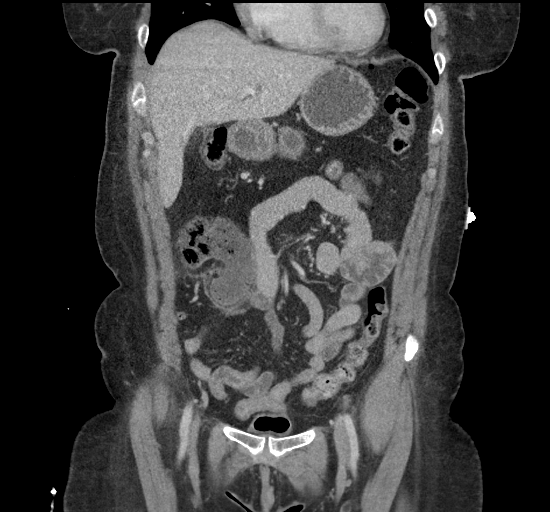
[im 67/151  soft-tissue]
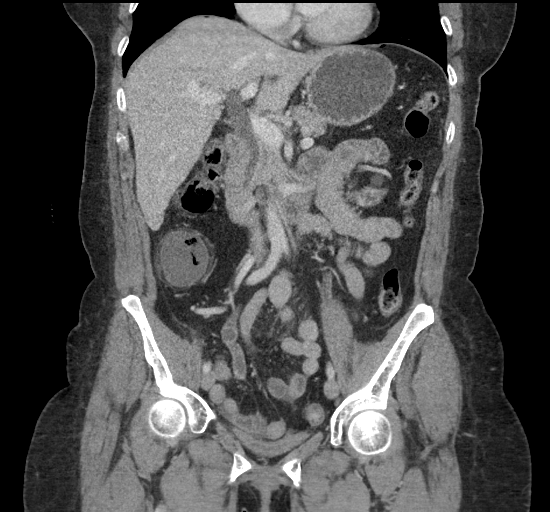
[im 84/151  soft-tissue]
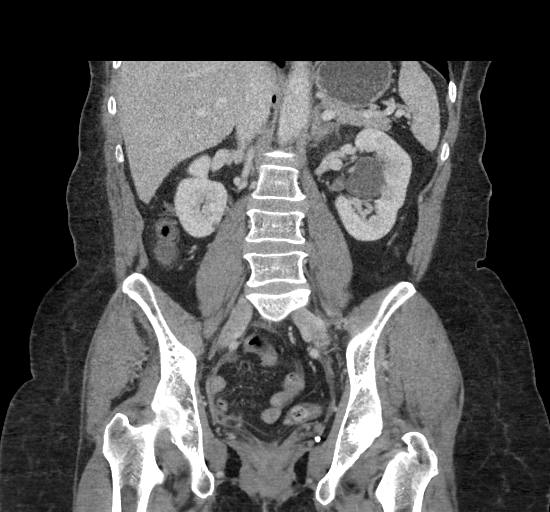

[16 of 46 positions shown; findings below may reference images not displayed]

RADIATION DOSE REDUCTION: This exam was performed according to the
departmental dose-optimization program which includes automated
exposure control, adjustment of the mA and/or kV according to
patient size and/or use of iterative reconstruction technique.

CONTRAST:  100mL OMNIPAQUE IOHEXOL 300 MG/ML  SOLN
FINDINGS: Lower chest: Lung bases are clear.

Hepatobiliary: Status post cholecystectomy. Trace stranding at the
gallbladder fossa likely due to recent surgery. Dilated common bile
duct measuring 15 mm. No intra hepatic biliary dilatation.
Subcentimeter hypodensity in the right hepatic lobe too small to
further characterize

Pancreas: Unremarkable. No pancreatic ductal dilatation or
surrounding inflammatory changes.

Spleen: Normal in size without focal abnormality.

Adrenals/Urinary Tract: Adrenal glands are normal. Kidneys show no
hydronephrosis. Multiple renal cysts and subcentimeter hypodensities
too small to further characterize. No follow-up imaging is
recommended. The bladder is unremarkable except for small amount of
air probably due to recent instrumentation

Stomach/Bowel: The stomach is non enlarged. There is no dilated
small bowel. Fluid in the right colon without convincing
inflammation. Negative appendix.

Vascular/Lymphatic: Mild atherosclerosis. No aneurysm. No suspicious
lymph node

Reproductive: Status post hysterectomy. No adnexal masses.

Other: Trace amounts of free air in the upper abdomen. Small free
fluid in the pelvis.

Musculoskeletal: Mild stranding in the subcutaneous soft tissues of
the right upper quadrant and the periumbilical region likely due to
laparoscopic port.
IMPRESSION: 1. Trace amounts of free air, this is felt secondary to the history
of recent gallbladder surgery. Minimal stranding at the gallbladder
fossa is likely due to recent surgery as well. Common bile duct is
dilated, but there is no intra hepatic biliary dilatation, correlate
with LFTs
2. Small amount of free fluid in the pelvis

## 2024-02-25 ENCOUNTER — Encounter: Payer: Self-pay | Admitting: Physical Medicine and Rehabilitation

## 2024-04-13 NOTE — Progress Notes (Unsigned)
 Subjective:    Patient ID: Lauren Woods, female    DOB: [birth year], 58 y.o.   MRN: [id number]  HPI  Lauren Woods is a 58 y.o. year old female  who  has no past medical history on file.   They are presenting to PM&R clinic for new visit related to right lumbar radiculopathy .    Discussed the use of AI scribe software for clinical note transcription with the patient, who gave verbal consent to proceed.  History of Present Illness   Lauren Woods is a 58 year old female with chronic right sacroiliac joint dysfunction and lower lumbar spondylosis who presents for evaluation of acute right lumbar radiculopathy.  Acute right-sided lumbar radiculopathy began on January 10, 2024, after she stepped off a platform at a pool tournament and developed severe right low back pain radiating to the right knee with numbness in the right thigh and foot that impaired walking. She sought care on January 22, 2024, after returning from travel.  Pain has gradually improved to a constant 3/10 with flares up to 7/10. Numbness in the right thigh and foot is unchanged. Symptoms are confined to the right side and do not extend past the knee. Pain is worsened by bending and prolonged standing. She denies back symptoms with compression or tight clothing and denies prior low back injury.  She reports a 40-year history of intermittent right hip subluxation sensation occurring about annually and relieved by lying supine or chiropractic manipulation, with a mild episode in early September 2025 before the current flare. Her chiropractor deferred manipulation pending imaging.  Urgent care treatment with intramuscular ketorolac and a short oral corticosteroid course provided significant relief. She now uses acetaminophen  intermittently for severe pain and prefers to avoid narcotics and neuropathic agents due to prior intolerance. She tried one dose of her brother's medication, which fully relieved pain but caused intolerable side  effects.  Current pain regimen is acetaminophen , cyclobenzaprine, lidocaine patches, and naproxen as needed. She has topical diclofenac  available but rarely uses it.  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 Lumbar spine MRI is scheduled for May 10, 2024.  She has not had injections or formal physical therapy for her back or hip. She previously had physical therapy for mild shoulder arthritis. She has unintentionally lost nearly 15 pounds since September 2025.          Pain Inventory Average Pain 3 Pain Right Now 3 My pain is constant, sharp, dull, tingling, and aching  In the last 24 hours, has pain interfered with the following? General activity 6 Relation with others 0 Enjoyment of life 6 What TIME of day is your pain at its worst? daytime and evening Sleep (in general) Poor  Pain is worse with: bending Pain improves with: pacing activities and stretching  Relief from Meds: no relief  walk without assistance ability to climb steps?  yes do you drive?  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  no New Patient  Any changes since last visit?  no    No family history on file. Social History   Socioeconomic History   Marital status: Married    Spouse name: Not on file   Number of children: Not on file   Years of education: Not on file   Highest education level: Not on  file  Occupational History   Not on file  Tobacco Use   Smoking status: Unknown   Smokeless tobacco: Never  Substance and Sexual Activity   Alcohol use: Not on file   Drug use: Not on file   Sexual activity: Not on file  Other Topics Concern   Not on file  Social History Narrative   Not on file   Social Drivers of Health   Tobacco Use: Unknown (10/04/2021)    Patient History    Smoking Tobacco Use: Unknown    Smokeless Tobacco Use: Never    Passive Exposure: Not on file  Financial Resource Strain: Not on file  Food Insecurity: Not on file  Transportation Needs: Not on file  Physical Activity: Not on file  Stress: Not on file  Social Connections: Not on file  Depression (EYV7-0): Not on file  Alcohol Screen: Not on file  Housing: Not on file  Utilities: Not on file  Health Literacy: Not on file   No past surgical history on file. No past medical history on file. There were no vitals taken for this visit.  Opioid Risk Score:   Fall Risk Score:  `1  Depression screen PHQ 2/9      No data to display          Review of Systems  Musculoskeletal:  Positive for back pain and gait problem.       Pain in lower right back down to above the knee level  Neurological:  Positive for numbness.       Burning pain Tingling pain Dull ache pain  All other systems reviewed and are negative.      Objective:   Physical Exam   Constitutional: No apparent distress. Appropriate appearance for age.  Cardiovascular: RRR, no murmurs/rub/gallops. No Edema. Peripheral pulses 2+  Respiratory: CTAB. No rales, rhonchi, or wheezing. On RA.  Abdomen: + bowel sounds, normoactive. No distention or tenderness.   Skin: C/D/I. No apparent lesions. MSK:      No apparent deformity.       Neurologic exam:  Cognition: AAO to person, place, time and event.  Insight: Good  insight into current condition.  Mood: Pleasant affect, appropriate mood.  Sensation: To light touch absent over anterior right thigh - reduced in L4-S1 dermatomes RLE Reflexes: 2+ in BL UE and LEs. Negative Hoffman's and babinski signs bilaterally.  CN: 2-12 grossly intact.  Coordination: No apparent tremors. No ataxia on FTN, HTS bilaterally.  Spasticity: MAS 0 in all extremities.       Strength: 4/5 R HF, KE, KF; otherwise 5/5 throughout  Back Exam:   Inspection: Pelvis was even.   Lumbar lordotic curvature was  wnl .  There was no evidence of scoliosis.  Palpation: Palpatory exam revealed ttp at the R PSIS, SI joint, and ischial tuberosity . There was  no evidence of spasm.   ROM revealed restricted ROM in right hip flexion due to gaurding . Special/provocative testing:    SLR: + RLE   Slump test: -    Facet loading: -(very non-specific)   TTP at paraspinals: -(sensitive for facet pain...if no ttp then likely not facet pain)   Deri test: ++ R posterior hip   FAIR test:  ++ R posterior hip   Gaenslen test:  ++ R posterior hip   Yeoman's test:  ++ R posterior hip    Thomas Test: ++ BL LE   Information in () parenthesis is normals/details of specific exam.  Assessment & Plan:   Tashayla Therien is a 58 y.o. year old female  who  has no past medical history on file.   They are presenting to PM&R clinic for right posterior hip pain with likely SI joint insufficiency and now RLE radiculopathy.  Assessment and Plan    Right lumbar radiculopathy Acute right-sided lumbar radiculopathy likely due to herniated disc with nerve root impingement. Symptoms improved, favoring conservative management. - Scheduled MRI lumbar spine for May 10, 2024. - Instructed her to obtain a copy of the MRI and ensure results are sent for review. - Discussed options of epidural steroid injection and physical therapy if MRI confirms nerve impingement and symptoms persist. - Advised acetaminophen  as needed for pain, not exceeding 3,000 mg daily. - Recommended topical diclofenac  (Voltaren  gel) up to four times daily for localized pain. - Deferred narcotic and neuropathic pain medications due to her preference and improving symptoms. - Planned follow-up in one month to review MRI results and reassess symptoms.  Chronic right SI joint insufficiency Chronic right sacroiliac joint dysfunction with symptoms consistent with SI joint pathology, not intra-articular hip pathology. -  Determined MRI of the hip is not indicated. - Advised continuation of conservative management, including topical diclofenac  and acetaminophen  as needed. - Physical therapy may be considered after MRI results, depending on findings and symptom persistence.  Lower lumbar spondylosis Mild lower lumbar spondylosis contributing to baseline low back discomfort, not primary cause of acute symptoms. - No specific intervention for spondylosis at this time; management incorporated into overall conservative treatment plan for low back pain. - Will reassess after MRI results and clinical follow-up.

## 2024-04-14 ENCOUNTER — Encounter: Admitting: Physical Medicine and Rehabilitation

## 2024-04-14 VITALS — BP 127/79 | HR 82 | Wt 164.0 lb

## 2024-04-14 DIAGNOSIS — M532X8 Spinal instabilities, sacral and sacrococcygeal region: Secondary | ICD-10-CM | POA: Diagnosis not present

## 2024-04-14 DIAGNOSIS — M25551 Pain in right hip: Secondary | ICD-10-CM | POA: Insufficient documentation

## 2024-04-14 DIAGNOSIS — S73001S Unspecified subluxation of right hip, sequela: Secondary | ICD-10-CM | POA: Insufficient documentation

## 2024-04-14 DIAGNOSIS — M5416 Radiculopathy, lumbar region: Secondary | ICD-10-CM | POA: Diagnosis not present

## 2024-04-14 MED ORDER — DICLOFENAC SODIUM 1 % EX GEL: 4.0000 g | Freq: Four times a day (QID) | CUTANEOUS | Status: AC

## 2024-04-14 NOTE — Patient Instructions (Signed)
 Use voltaren  gel and tylenol  as needed for pain control Have Mri results sent to me Follow up in 1 month

## 2024-05-24 ENCOUNTER — Encounter: Admitting: Physical Medicine and Rehabilitation

## 2024-06-23 ENCOUNTER — Encounter: Attending: Physical Medicine and Rehabilitation | Admitting: Physical Medicine and Rehabilitation
